# Patient Record
Sex: Male | Born: 2000 | Race: White | Hispanic: No | Marital: Single | Smoking: Never smoker
Health system: Southern US, Community
[De-identification: ages and names within clinical notes are randomized; demographics above are authoritative.]

## PROBLEM LIST (undated history)

## (undated) DIAGNOSIS — R569 Unspecified convulsions: Secondary | ICD-10-CM

## (undated) HISTORY — PX: CIRCUMCISION: SUR203

## (undated) HISTORY — DX: Unspecified convulsions: R56.9

---
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
## 2022-03-14 ENCOUNTER — Other Ambulatory Visit (INDEPENDENT_AMBULATORY_CARE_PROVIDER_SITE_OTHER): Payer: Self-pay | Admitting: Pediatrics

## 2022-03-14 DIAGNOSIS — G40309 Generalized idiopathic epilepsy and epileptic syndromes, not intractable, without status epilepticus: Secondary | ICD-10-CM

## 2022-03-14 MED ORDER — LEVETIRACETAM 100 MG/ML PO SOLN: 900.0000 mg | Freq: Two times a day (BID) | ORAL | 0 refills | Status: DC

## 2022-03-14 NOTE — Telephone Encounter (Signed)
  Name of who is calling: jessica  Caller's Relationship to Patient: mom  Best contact number: [phone]  Provider they see: Dr. Loni Muse  Reason for call: trying to get a refill, pharmacist states they sent in requests but havent gotten anything back      [place]  Name of prescription:  Pharmacy:

## 2022-03-14 NOTE — Telephone Encounter (Signed)
Spoke with mom to see what the medicine is they need a refill on she states keppra. Was able to schedule follow up  appt with mom.

## 2022-03-27 ENCOUNTER — Ambulatory Visit (INDEPENDENT_AMBULATORY_CARE_PROVIDER_SITE_OTHER): Payer: Medicaid Other | Admitting: Pediatrics

## 2022-03-27 ENCOUNTER — Encounter (INDEPENDENT_AMBULATORY_CARE_PROVIDER_SITE_OTHER): Payer: Self-pay | Admitting: Pediatrics

## 2022-03-27 VITALS — BP 118/78 | HR 99 | Ht 63.58 in | Wt 200.0 lb

## 2022-03-27 DIAGNOSIS — G40309 Generalized idiopathic epilepsy and epileptic syndromes, not intractable, without status epilepticus: Secondary | ICD-10-CM | POA: Diagnosis not present

## 2022-03-27 MED ORDER — LEVETIRACETAM 100 MG/ML PO SOLN: 900.0000 mg | Freq: Two times a day (BID) | ORAL | 6 refills | Status: DC

## 2022-03-27 MED ORDER — NAYZILAM 5 MG/0.1ML NA SOLN: 5.0000 mg | NASAL | 2 refills | Status: AC | PRN

## 2022-03-27 NOTE — Progress Notes (Signed)
Patient: Jonathan Stanton MRN: [id number] Sex: male DOB: Sep 13, 2000  Provider: Franco Nones, MD Location of Care: Pediatric Specialist- Pediatric Neurology Note type: Return presents for follow-up Chief Complaint: Epilepsy   Interim history: Jonathan Stanton is 22 years old male with history of epilepsy.  He is accompanied by his mother.  He remained seizure-free since 2020. He takes tolerates Keppra 900 mg twice per day. Mother reports grandma gives him medication and never misses a dose.  He is currently staying home with no job or pursuing education.  There was difficult to establish care with adult neurology and their location.  The patient does not have to drive license and does not drive as well.  No concerns for today's visit  Epilepsy/seizure History: [name] had onset of seizures in March 2016.  The first was unwitnessed he fell striking his head months and was unconscious and jerking.  He had no memory for the events.  The second episode happened on Jun 02, 2014.  He was lying on the ground, drooling, staring, and his muscles were tense.  EEG Jun 07, 2014 showed generalized spike and slow wave discharges, left mid-temporal spike and slow wave discharge, and clusters of rhythmic bioccipital spikes.    He was placed on levetiracetam him because of concerns that he had both focal seizures with general secondary generalization.   EEG August 19, 2016 was normal.  As result of this study, a decision was made to taper and discontinue his antiepileptic medication.  This went well until Jun 04, 2018 when he presented with another unwitnessed seizure he was found in the room on his bed with a table knocked over and a bruise above his left eye involving the eyelid.  He was disoriented he did not have any drooling, but he had bitten the left side of his tongue.  His legs are aching.  He did not vomit.  It took about 20 minutes to recover.   Levetiracetam was restarted.  Age at seizure onset: 23 years old.   Description of all seizure types and duration: falls down/passes out and mild shaking for ~30 seconds. Complications from seizures (trauma, etc.): none h/o status epilepticus? no  Date of most recent seizure: Jun 04, 2018 Seizure frequency past month (exact number or average per day): none Past 3 months: none Past year: none   Current AEDs: Keppra 900 mg BID. Current side effects: None Prior AEDs (d/c reason?):  None  Other Meds: None  Adherence Estimate: Valu.Nieves ] Excellent   Epilepsy risk factors:   Maternal pregnancy/delivery and postnatal course normal.  Normal development.  No h/o staring spells or febrile seizures.  No meningitis/encephalitis, no h/o LOC or head trauma.  PMH/PSH:  Epilepsy Anemia  Allergy: NKDA  Birth History: Birth weight was 6lbs 13oz. Pregnancy and delivery uncomplicated. Mother received epidural anesthesia with vacuum-assisted vaginal delivery.   Social and family history: He lives with grandmother.  He has brothers and sisters who live with mother.  Both parents are in apparent good health.  Siblings are also healthy. There is no family history of speech delay, learning difficulties in school, mental retardation, or neuromuscular disorders. There is family history of epilepsy. Maternal grandmother began epilepsy symptoms in 25s.   Adolescent history: He denies use of alcohol, cigarette smoking or street drugs. He is not driving.   Review of Systems: There is no history of fevers, chills, malaise, loss of appetite, weight loss, or difficulty sleeping.  Ophthalmologic, otolaryngologic, dermatologic, respiratory, cardiovascular, gastrointestinal, genitourinary,  musculoskeletal, endocrine, psychiatric, and hematologic review of systems were negative.    EXAMINATION Physical examination: Today's Vitals   03/27/22 1556  BP: 118/78  Pulse: 99  Weight: 199 lb 15.3 oz (90.7 kg)  Height: 5' 3.58" (1.615 m)   Body mass index is 34.77 kg/m.  General  examination: Mccoy is alert and active in no apparent distress. There are no dysmorphic features.   Chest examination reveals normal breath sounds, and normal heart sounds with no cardiac murmur.  Abdominal examination does not show any evidence of hepatic or splenic enlargement, or any abdominal masses or bruits, with normal genitalia.  Skin evaluation does not reveal any caf-au-lait spots, hypo or hyperpigmented lesions, hemangiomas or pigmented nevi. Neurologic examination: He is awake, alert, cooperative and responsive to all questions.  He follows all commands readily.  Speech is fluent, with no echolalia.  He is able to name and repeat.   Cranial nerves: Pupils are 3 mm, symmetric, circular and reactive to light.  Extraocular movements are full in range, with no strabismus.  There is no ptosis or nystagmus.  Facial sensations are intact.  There is no facial asymmetry, with normal facial movements bilaterally.  Hearing is normal to finger-rub testing. Palatal movements are symmetric.  The tongue is midline. Motor assessment: The tone is normal.  Movements are symmetric in all four extremities, with no evidence of any focal weakness.  Power is more than III / V in all groups of muscles across all major joints.  There is no evidence of atrophy or hypertrophy of muscles.  Deep tendon reflexes are 2+ and symmetric at the biceps, knees and ankles.  Plantar response is flexor bilaterally. Sensory examination: Intact sensation. Co-ordination and gait:  Finger-to-nose testing is normal bilaterally.  Fine finger movements and rapid alternating movements are within normal range.  Mirror movements are not present.  There is no evidence of tremor, dystonic posturing or any abnormal movements.   Romberg's sign is absent.  Gait is normal with equal arm swing bilaterally and symmetric leg movements.  Heel, toe and tandem walking are within normal range.   Developmental assessment: developmentally appropriate for  age  Psychiatric and behavioral screening (anxiety, depression, suicidality, mood disorder, attention deficit hyperactive disorder, cognitive dysfunction, or other neurobehavioral disorders, or IEP): None  PREVIOUS WORK-UP EEG: 06/07/2014  Dominant frequency is 70-150 V, 8-9 Hz, alpha range activity that is well modulated and well regulated, posteriorly distributed, and attenuates with eye opening.     Background activity consists of Mixed frequency less than 20 V alpha and beta range activity with admixed theta.  The patient has epileptiform discharges on page 27 with a left mid-temporal high-voltage spike and slow-wave discharge with field associated with generalized delta range activity and a cluster of rhythmic bi-occipital spikes.  On page 107 there is evidence of a generalized spike and slow-wave discharge.  There were no behaviors and no electrographic seizures.   Activating procedures included intermittent photic stimulation, and hyperventilation.  Intermittent photic stimulation induced a driving response at 11 Hz.  Hyperventilation caused mild background slowing.   EKG showed a regular sinus rhythm with a ventricular response of 90 beats per minute.  IMPRESSION (summary statement): Jared is an 22 year old male with generalized clonic-tonic seizures.  He remained seizure-free on on keppra 900 mg BID. He has been seizure free since May 2020.  Previously, tried to wean Keppra off.  However, he had recurrent seizures.  Plan to continue on keppra as prescribed and transition care  to adult neurology.  Discussed with the mother and the patient for possibility to wean Keppra off after repeated EEG resulted normal.  However, I think the discussion about this should be with other neurology.  PLAN: Transition care to adult neurology. Referral to adult neurology was sent.  Continue Keppra 9 ml twice a day. Nayzilam 5 mg nasal spray for seizures > 3 minutes No driving until seizure-free 6 months.   The patient does not have to drive license and does not drive. Call neurology for any questions or concern   Counseling/Education:  '[X]'$  AED adverse effects      '[x]'$  driving     [ X] seizure safety   Franco Nones, MD

## 2022-03-27 NOTE — Patient Instructions (Addendum)
Transition care to adult neurology. Referral to adult neurology was sent [facility].  Continue Keppra 9 ml twice a day  Nayzilam 5 mg nasal spray for seizures > 3 minutes No follow up with pediatric neurology

## 2022-03-28 ENCOUNTER — Encounter: Payer: Self-pay | Admitting: Neurology

## 2022-03-28 ENCOUNTER — Other Ambulatory Visit (INDEPENDENT_AMBULATORY_CARE_PROVIDER_SITE_OTHER): Payer: Self-pay | Admitting: Pediatrics

## 2022-03-28 NOTE — Telephone Encounter (Signed)
I did it yesterday. Any issue

## 2022-05-02 ENCOUNTER — Encounter: Payer: Self-pay | Admitting: Neurology

## 2022-05-02 ENCOUNTER — Ambulatory Visit (INDEPENDENT_AMBULATORY_CARE_PROVIDER_SITE_OTHER): Payer: Medicaid Other | Admitting: Neurology

## 2022-05-02 DIAGNOSIS — G40309 Generalized idiopathic epilepsy and epileptic syndromes, not intractable, without status epilepticus: Secondary | ICD-10-CM | POA: Diagnosis not present

## 2022-05-02 MED ORDER — LEVETIRACETAM 100 MG/ML PO SOLN: 900.0000 mg | Freq: Two times a day (BID) | ORAL | 11 refills | Status: DC

## 2022-05-02 NOTE — Progress Notes (Signed)
NEUROLOGY CONSULTATION NOTE  Jonathan Stanton MRN: [id number] DOB: 05/11/2000  Referring provider: Dr. Lezlie Stanton Primary care provider: [facility]  Reason for consult:  establish adult epilepsy care  Dear Jonathan Stanton:  Thank you for your kind referral of Jonathan Stanton for consultation of the above symptoms. Although his history is well known to you, please allow me to reiterate it for the purpose of our medical record. The patient was accompanied to the clinic by his mother Jonathan Stanton who also provides collateral information. Records and images were personally reviewed where available.   HISTORY OF PRESENT ILLNESS: This is a pleasant 22 year old right-handed man presenting to establish care for seizures. His mother Jonathan Stanton is present to provide additional information. The first seizure occurred in March 2016. His mother reports he was playing then fell on the ground and was having a seizure. It lasted a few seconds, he was confused after. No tongue bite or incontinence. His mother recalls he was started on Levetiracetam and then as it was being weaned off in May, he had another seizure where he just fell with drooling, then quickly stood up. No jerking. Notes indicate the second seizure in May 2016 consisted of drooling, staring, muscles tense. EEG in 05/2014 reported generalized spike and slow wave discharges, left mid-temporal spike and slow wave discharge, and clusters of rhythmic bioccipital spikes. Per notes he was started on Levetiracetam at this time (mother reports he was being weaned off and it was restarted). He then went 2 years seizure-free and had a normal EEG in July 2018 and medication was tapered off. He was seizure-free for 2 years until 05/25/2018 when he had an unwitnessed seizure, he apparently fell in his room, his mother found him with injury over the left eye, confused. His legs were aching. Notes indicate he bit the left side of his tongue. Levetiracetam  was restarted, he has been on 900mg  BID (28mL BID) since then, he has difficulty swallowing pills.   He and his mother deny any staring/unresponsive episodes. He denies any gaps in time, olfactory/gustatory hallucinations, deja vu, rising epigastric sensation, focal numbness/tingling/weakness, myoclonic jerks. He denies any headaches, dizziness, diplopia, dysarthria, dysphagia, neck/back pain, focal numbness/tingling/weakness, bowel/bladder dysfunction. He gets 8 hours of sleep. Mood is good. He lives with his grandmother who administers his medication. He does not drive. He finished 12th grade in regular classes. He is unemployed.   Epilepsy Risk Factors:  Maternal grandmother had seizures in adulthood. He had a normal birth and early development.  There is no history of febrile convulsions, CNS infections such as meningitis/encephalitis, significant traumatic brain injury, neurosurgical procedures.  PAST MEDICAL HISTORY: Past Medical History:  Diagnosis Date   Seizures     PAST SURGICAL HISTORY: Past Surgical History:  Procedure Laterality Date   CIRCUMCISION  2004    MEDICATIONS: Current Outpatient Medications on File Prior to Visit  Medication Sig Dispense Refill   Ascorbic Acid (VITAMIN C) 500 MG/5ML LIQD Take 2.5 mLs (250 mg total) by mouth daily. 473 mL 0   levETIRAcetam (KEPPRA) 100 MG/ML solution Take 9 mLs (900 mg total) by mouth 2 (two) times daily. 540 mL 6   Midazolam (NAYZILAM) 5 MG/0.1ML SOLN Place 5 mg into the nose as needed (give one nasal spray for seizures lasting 3 minutes or longer). 1 each 2   multivitamin-iron-minerals-folic acid (CENTRUM) chewable tablet Chew 1 tablet by mouth daily. 30 tablet 1   No current facility-administered medications on file prior  to visit.    ALLERGIES: No Known Allergies  FAMILY HISTORY: Family History  Problem Relation Age of Onset   Seizures Maternal Grandmother     SOCIAL HISTORY: Social History   Socioeconomic History    Marital status: Single    Spouse name: Not on file   Number of children: Not on file   Years of education: Not on file   Highest education level: Not on file  Occupational History   Not on file  Tobacco Use   Smoking status: Never    Passive exposure: Never   Smokeless tobacco: Never  Vaping Use   Vaping Use: Never used  Substance and Sexual Activity   Alcohol use: No    Alcohol/week: 0.0 standard drinks of alcohol   Drug use: No   Sexual activity: Never  Other Topics Concern   Not on file  Social History Narrative   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    He attends [employer].    He lives with his mother and siblings.    He enjoys playing video games, watching TV.   Right handed    Social Determinants of Health   Financial Resource Strain: Not on file  Food Insecurity: Not on file  Transportation Needs: Not on file  Physical Activity: Not on file  Stress: Not on file  Social Connections: Not on file  Intimate Partner Violence: Not on file     PHYSICAL EXAM: Vitals:   05/02/22 1028  BP: 122/76  Pulse: (!) 108  SpO2: 99%   General: No acute distress Head:  Normocephalic/atraumatic Skin/Extremities: No rash, no edema Neurological Exam: Mental status: alert and oriented to person, place, and time, no dysarthria or aphasia, Fund of knowledge is appropriate.  Recent and remote memory are intact, 3/3 delayed recall.  Attention and concentration are normal, 5/5 WORLD backwards. Cranial nerves: CN I: not tested CN II: pupils equal, round, visual fields intact CN III, IV, VI:  full range of motion, no nystagmus, no ptosis CN V: facial sensation intact CN VII: upper and lower face symmetric CN VIII: hearing intact to conversation Bulk & Tone: normal, no fasciculations. Motor: 5/5 throughout with no pronator drift. Sensation: intact to light touch, cold, pin, vibration sense.  No extinction to double simultaneous stimulation.  Romberg test negative Deep  Tendon Reflexes: brisk +2 throughout Cerebellar: no incoordination on finger to nose testing Gait: narrow-based and steady, able to tandem walk adequately. Tremor: none   IMPRESSION: This is a pleasant 22 year old right-handed man with epilepsy, etiology unknown. Prior EEG reported generalized spike and slow wave discharges, left mid-temporal spike and slow wave discharge, and clusters of rhythmic bioccipital spikes. His mother reports seizures each time medication was weaned, last seizure in 2020 (medication weaned off in 2018). We agreed on continuation of seizure medication at this point, he denies any side effects, refills sent for Levetiracetam 900mg  BID. Check baseline Keppra level. He does not drive.Follow-up in 6 months, they know to call for any changes.   Thank you for allowing me to participate in the care of this patient. Please do not hesitate to call for any questions or concerns.   Patrcia Dolly, M.D.  CC: Jonathan. Moody [facility]

## 2022-05-02 NOTE — Patient Instructions (Addendum)
Good to meet you. Have Keppra level done either first thing in the morning before you're first dose, or before the lab closes before evening dose.   Continue Levetiracetam (Keppra) 100mg /ml: Take 52mL (900mg ) twice a day. Follow-up in 6 months, call for any changes.   Seizure Precautions: 1. If medication has been prescribed for you to prevent seizures, take it exactly as directed.  Do not stop taking the medicine without talking to your doctor first, even if you have not had a seizure in a long time.   2. Avoid activities in which a seizure would cause danger to yourself or to others.  Don't operate dangerous machinery, swim alone, or climb in high or dangerous places, such as on ladders, roofs, or girders.  Do not drive unless your doctor says you may.  3. If you have any warning that you may have a seizure, lay down in a safe place where you can't hurt yourself.    4.  No driving for 6 months from last seizure, as per [facility].   Please refer to the following link on the Epile[employer] Foundation of [employer]'s website for more information: http://www.epilepsyfoundation.org/answerplace/Social/driving/drivingu.cfm   5.  Maintain good sleep hygiene. Avoid alcohol.  6.  Contact your doctor if you have any problems that may be related to the medicine you are taking.  7.  Call 911 and bring the patient back to the ED if:        A.  The seizure lasts longer than 5 minutes.       B.  The patient doesn't awaken shortly after the seizure  C.  The patient has new problems such as difficulty seeing, speaking or moving  D.  The patient was injured during the seizure  E.  The patient has a temperature over 102 F (39C)  F.  The patient vomited and now is having trouble breathing

## 2022-11-15 ENCOUNTER — Ambulatory Visit: Payer: Medicaid Other | Admitting: Neurology

## 2022-12-09 ENCOUNTER — Telehealth: Payer: Medicaid Other | Admitting: Neurology

## 2022-12-09 ENCOUNTER — Encounter: Payer: Self-pay | Admitting: Neurology

## 2022-12-09 DIAGNOSIS — G40309 Generalized idiopathic epilepsy and epileptic syndromes, not intractable, without status epilepticus: Secondary | ICD-10-CM | POA: Diagnosis not present

## 2022-12-09 MED ORDER — LEVETIRACETAM 100 MG/ML PO SOLN: 900.0000 mg | Freq: Two times a day (BID) | ORAL | 11 refills | Status: DC

## 2022-12-09 NOTE — Progress Notes (Signed)
Virtual Visit via Video Note The purpose of this virtual visit is to provide medical care while limiting exposure to the novel coronavirus.    Consent was obtained for video visit:  Yes.   Answered questions that patient had about telehealth interaction:  Yes.    Pt location: Home Physician Location: office Name of referring provider:  [facility] I connected with BRIJESH VITE at patients initiation/request on 12/09/2022 at  2:00 PM EST by video enabled telemedicine application and verified that I am speaking with the correct person using two identifiers. Pt MRN:  [id number] Pt DOB:  2000-12-02 Video Participants:  Karel Jarvis   History of Present Illness:  The patient had a virtual video visit on 12/09/2022. He was last seen 6 months ago for seizures. Prior EEG reported generalized spike and slow wave discharges, left mid-temporal spike and slow wave discharge, and clusters of rhythmic bioccipital spikes. He is doing well and continues to deny any seizures since 2020 on Levetiracetam 900mg  BID (9mL BID) without side effects. He has not needed prn Nayzilam. He denies any staring/unresponsive episodes, gaps in time, olfactory/gustatory hallucinations, focal numbness/tingling/weakness, myoclonic jerks. No headaches, dizziness, vision changes, no falls. He gets 8 hours of sleep. Mood is good. He lives with his grandmother who pours out his medication. He does not drive.    History on Initial Assessment 05/02/2022: This is a pleasant 22 year old right-handed man presenting to establish care for seizures. His mother Shanda Bumps is present to provide additional information. The first seizure occurred in March 2016. His mother reports he was playing then fell on the ground and was having a seizure. It lasted a few seconds, he was confused after. No tongue bite or incontinence. His mother recalls he was started on Levetiracetam and then as it was being weaned off in May, he had another seizure  where he just fell with drooling, then quickly stood up. No jerking. Notes indicate the second seizure in May 2016 consisted of drooling, staring, muscles tense. EEG in 05/2014 reported generalized spike and slow wave discharges, left mid-temporal spike and slow wave discharge, and clusters of rhythmic bioccipital spikes. Per notes he was started on Levetiracetam at this time (mother reports he was being weaned off and it was restarted). He then went 2 years seizure-free and had a normal EEG in July 2018 and medication was tapered off. He was seizure-free for 2 years until 05/25/2018 when he had an unwitnessed seizure, he apparently fell in his room, his mother found him with injury over the left eye, confused. His legs were aching. Notes indicate he bit the left side of his tongue. Levetiracetam was restarted, he has been on 900mg  BID (9mL BID) since then, he has difficulty swallowing pills.   He and his mother deny any staring/unresponsive episodes. He denies any gaps in time, olfactory/gustatory hallucinations, deja vu, rising epigastric sensation, focal numbness/tingling/weakness, myoclonic jerks. He denies any headaches, dizziness, diplopia, dysarthria, dysphagia, neck/back pain, focal numbness/tingling/weakness, bowel/bladder dysfunction. He gets 8 hours of sleep. Mood is good. He lives with his grandmother who administers his medication. He does not drive. He finished 12th grade in regular classes. He is unemployed.   Epilepsy Risk Factors:  Maternal grandmother had seizures in adulthood. He had a normal birth and early development.  There is no history of febrile convulsions, CNS infections such as meningitis/encephalitis, significant traumatic brain injury, neurosurgical procedures.   Current Outpatient Medications on File Prior to Visit  Medication Sig  Dispense Refill   Ascorbic Acid (VITAMIN C) 500 MG/5ML LIQD Take 2.5 mLs (250 mg total) by mouth daily. 473 mL 0   levETIRAcetam (KEPPRA) 100 MG/ML  solution Take 9 mLs (900 mg total) by mouth 2 (two) times daily. 540 mL 11   Midazolam (NAYZILAM) 5 MG/0.1ML SOLN Place 5 mg into the nose as needed (give one nasal spray for seizures lasting 3 minutes or longer). 1 each 2   multivitamin-iron-minerals-folic acid (CENTRUM) chewable tablet Chew 1 tablet by mouth daily. 30 tablet 1   No current facility-administered medications on file prior to visit.     Observations/Objective:   Vitals:   12/09/22 1327  Weight: 199 lb (90.3 kg)   GEN:  The patient appears stated age and is in NAD.  Neurological examination: Patient is awake, alert. No aphasia or dysarthria. Intact fluency and comprehension.Cranial nerves: Extraocular movements intact with no nystagmus. No facial asymmetry. Motor: moves all extremities symmetrically, at least anti-gravity x 4. No incoordination on finger to nose testing. Gait: narrow-based and steady, able to tandem walk adequately.   Assessment and Plan:   This is a pleasant 22 yo RH man with epilepsy, etiology unknown. Prior EEG reported generalized spike and slow wave discharges, left mid-temporal spike and slow wave discharge, and clusters of rhythmic bioccipital spikes. He has been seizure-free since 2020 on Levetiracetam 900mg  BID (9mL BID), refills sent. We discussed avoidance of seizure triggers, including missing medication, sleep deprivation, alcohol. He does not drive. Follow-up in 1 year, call for any changes.    Follow Up Instructions:   -I discussed the assessment and treatment plan with the patient. The patient was provided an opportunity to ask questions and all were answered. The patient agreed with the plan and demonstrated an understanding of the instructions.   The patient was advised to call back or seek an in-person evaluation if the symptoms worsen or if the condition fails to improve as anticipated.    Van Clines, MD

## 2022-12-09 NOTE — Patient Instructions (Signed)
Good to see you doing well. Continue Keppra: Take 9mL twice a day. Follow-up in 1 year, call for any changes.    Seizure Precautions: 1. If medication has been prescribed for you to prevent seizures, take it exactly as directed.  Do not stop taking the medicine without talking to your doctor first, even if you have not had a seizure in a long time.   2. Avoid activities in which a seizure would cause danger to yourself or to others.  Don't operate dangerous machinery, swim alone, or climb in high or dangerous places, such as on ladders, roofs, or girders.  Do not drive unless your doctor says you may.  3. If you have any warning that you may have a seizure, lay down in a safe place where you can't hurt yourself.    4.  No driving for 6 months from last seizure, as per [facility].   Please refer to the following link on the Epile[employer] Foundation of [place]'s website for more information: http://www.epilepsyfoundation.org/answerplace/Social/driving/drivingu.cfm   5.  Maintain good sleep hygiene.  6.  Contact your doctor if you have any problems that may be related to the medicine you are taking.  7.  Call 911 and bring the patient back to the ED if:        A.  The seizure lasts longer than 5 minutes.       B.  The patient doesn't awaken shortly after the seizure  C.  The patient has new problems such as difficulty seeing, speaking or moving  D.  The patient was injured during the seizure  E.  The patient has a temperature over 102 F (39C)  F.  The patient vomited and now is having trouble breathing

## 2023-02-06 ENCOUNTER — Encounter: Payer: Self-pay | Admitting: Neurology

## 2023-11-24 ENCOUNTER — Ambulatory Visit: Payer: Medicaid Other | Admitting: Neurology

## 2023-11-25 ENCOUNTER — Encounter: Payer: Self-pay | Admitting: Neurology

## 2023-11-25 ENCOUNTER — Ambulatory Visit: Payer: Medicaid Other | Admitting: Neurology

## 2024-02-21 ENCOUNTER — Other Ambulatory Visit: Payer: Self-pay | Admitting: Neurology

## 2024-02-21 DIAGNOSIS — G40309 Generalized idiopathic epilepsy and epileptic syndromes, not intractable, without status epilepticus: Secondary | ICD-10-CM

## 2024-03-03 ENCOUNTER — Ambulatory Visit: Admitting: Neurology
